# Patient Record
Sex: Female | Born: 1975 | Race: White | Hispanic: No | Marital: Married | Smoking: Never smoker
Health system: Southern US, Community
[De-identification: ages and names within clinical notes are randomized; demographics above are authoritative.]

## PROBLEM LIST (undated history)

## (undated) ENCOUNTER — Inpatient Hospital Stay (HOSPITAL_COMMUNITY): Payer: Self-pay

## (undated) DIAGNOSIS — IMO0002 Reserved for concepts with insufficient information to code with codable children: Secondary | ICD-10-CM

## (undated) DIAGNOSIS — B009 Herpesviral infection, unspecified: Secondary | ICD-10-CM

## (undated) DIAGNOSIS — R87619 Unspecified abnormal cytological findings in specimens from cervix uteri: Secondary | ICD-10-CM

## (undated) HISTORY — PX: LEEP: SHX91

## (undated) HISTORY — PX: NO PAST SURGERIES: SHX2092

---

## 2010-06-01 NOTE — L&D Delivery Note (Signed)
Pt was admitted in early labor.  She had an amniotomy at 7cm because she was making no progress.  Pt completed the first stage without difficulty.  Pt pushed for 2 hours and .  The VE was placed at +2 station secondary to exhaustion.  She delivered one live viable white female over 3 degree midline tear in ROA position on second contraction.  Private cord blood was obtained.  Placenta S/I.  Pt also had a left vaginal wall tear.  All tears were closed with 2-0 Vicryl and 3-0 chromic.  EBL-500cc Baby to NBN.

## 2010-07-15 LAB — RUBELLA ANTIBODY, IGM: Rubella: IMMUNE

## 2010-07-15 LAB — STREP B DNA PROBE: GBS: NEGATIVE

## 2010-07-15 LAB — HIV ANTIBODY (ROUTINE TESTING W REFLEX): HIV: NONREACTIVE

## 2010-07-15 LAB — TYPE AND SCREEN: Antibody Screen: NEGATIVE

## 2010-07-15 LAB — ABO/RH: "RH Type ": POSITIVE

## 2010-07-15 LAB — HEPATITIS B SURFACE ANTIGEN: Hepatitis B Surface Ag: NEGATIVE

## 2010-07-15 LAB — RPR: RPR: NONREACTIVE

## 2011-01-03 ENCOUNTER — Encounter (HOSPITAL_COMMUNITY): Payer: Self-pay

## 2011-01-03 ENCOUNTER — Inpatient Hospital Stay (HOSPITAL_COMMUNITY)
Admission: AD | Admit: 2011-01-03 | Discharge: 2011-01-06 | DRG: 775 | Disposition: A | Discharge status: Home / Self Care | Payer: Managed Care, Other (non HMO) | Source: Ambulatory Visit | Attending: Obstetrics and Gynecology | Admitting: Obstetrics and Gynecology

## 2011-01-03 ENCOUNTER — Encounter (HOSPITAL_COMMUNITY): Payer: Self-pay | Admitting: Anesthesiology

## 2011-01-03 ENCOUNTER — Inpatient Hospital Stay (HOSPITAL_COMMUNITY): Payer: Managed Care, Other (non HMO) | Admitting: Anesthesiology

## 2011-01-03 DIAGNOSIS — Z3480 Encounter for supervision of other normal pregnancy, unspecified trimester: Secondary | ICD-10-CM

## 2011-01-03 DIAGNOSIS — D649 Anemia, unspecified: Secondary | ICD-10-CM | POA: Diagnosis not present

## 2011-01-03 DIAGNOSIS — O9903 Anemia complicating the puerperium: Secondary | ICD-10-CM | POA: Diagnosis not present

## 2011-01-03 HISTORY — DX: Herpesviral infection, unspecified: B00.9

## 2011-01-03 HISTORY — DX: Unspecified abnormal cytological findings in specimens from cervix uteri: R87.619

## 2011-01-03 HISTORY — DX: Reserved for concepts with insufficient information to code with codable children: IMO0002

## 2011-01-03 LAB — ABO/RH: ABO/RH(D): A POS

## 2011-01-03 MED ORDER — OXYTOCIN 20 UNITS IN LACTATED RINGERS INFUSION - SIMPLE
125.0000 mL/h | Freq: Once | INTRAVENOUS | Status: DC
  Filled 2011-01-03: qty 1000

## 2011-01-03 MED ORDER — NALBUPHINE SYRINGE 5 MG/0.5 ML
5.0000 mg | INJECTION | INTRAMUSCULAR | Status: DC | PRN
  Filled 2011-01-03: qty 0.5

## 2011-01-03 MED ORDER — IBUPROFEN 600 MG PO TABS
600.0000 mg | ORAL_TABLET | Freq: Four times a day (QID) | ORAL | Status: DC | PRN
  Administered 2011-01-04: 600 mg via ORAL
  Filled 2011-01-03: qty 1

## 2011-01-03 MED ORDER — LACTATED RINGERS IV SOLN
500.0000 mL | INTRAVENOUS | Status: DC | PRN
  Administered 2011-01-04: 500 mL via INTRAVENOUS

## 2011-01-03 MED ORDER — IBUPROFEN 600 MG PO TABS: 600.0000 mg | ORAL_TABLET | Freq: Four times a day (QID) | ORAL | Status: DC | PRN

## 2011-01-03 MED ORDER — OXYCODONE-ACETAMINOPHEN 5-325 MG PO TABS: 2.0000 | ORAL_TABLET | ORAL | Status: DC | PRN

## 2011-01-03 MED ORDER — ONDANSETRON HCL 4 MG/2ML IJ SOLN: 4.0000 mg | Freq: Four times a day (QID) | INTRAMUSCULAR | Status: DC | PRN

## 2011-01-03 MED ORDER — OXYTOCIN 20 UNITS IN LACTATED RINGERS INFUSION - SIMPLE: 125.0000 mL/h | Freq: Once | INTRAVENOUS | Status: DC

## 2011-01-03 MED ORDER — LACTATED RINGERS IV SOLN: INTRAVENOUS | Status: DC

## 2011-01-03 MED ORDER — DIPHENHYDRAMINE HCL 50 MG/ML IJ SOLN: 12.5000 mg | INTRAMUSCULAR | Status: DC | PRN

## 2011-01-03 MED ORDER — ACETAMINOPHEN 325 MG PO TABS: 650.0000 mg | ORAL_TABLET | ORAL | Status: DC | PRN

## 2011-01-03 MED ORDER — SODIUM CHLORIDE 0.9 % IJ SOLN: 3.0000 mL | Freq: Two times a day (BID) | INTRAMUSCULAR | Status: DC

## 2011-01-03 MED ORDER — PENICILLIN G POTASSIUM 5000000 UNITS IJ SOLR: 5.0000 10*6.[IU] | Freq: Once | INTRAVENOUS | Status: DC

## 2011-01-03 MED ORDER — SODIUM CHLORIDE 0.9 % IV SOLN: 250.0000 mL | INTRAVENOUS | Status: DC

## 2011-01-03 MED ORDER — FENTANYL 2.5 MCG/ML BUPIVACAINE 1/10 % EPIDURAL INFUSION (WH - ANES)
14.0000 mL/h | INTRAMUSCULAR | Status: DC
  Administered 2011-01-03 – 2011-01-04 (×5): 14 mL/h via EPIDURAL
  Filled 2011-01-03 (×5): qty 60

## 2011-01-03 MED ORDER — EPHEDRINE 5 MG/ML INJ
10.0000 mg | INTRAVENOUS | Status: DC | PRN
  Filled 2011-01-03: qty 4

## 2011-01-03 MED ORDER — LACTATED RINGERS IV SOLN: 500.0000 mL | INTRAVENOUS | Status: DC | PRN

## 2011-01-03 MED ORDER — PHENYLEPHRINE 40 MCG/ML (10ML) SYRINGE FOR IV PUSH (FOR BLOOD PRESSURE SUPPORT)
80.0000 ug | PREFILLED_SYRINGE | INTRAVENOUS | Status: DC | PRN
  Filled 2011-01-03: qty 5

## 2011-01-03 MED ORDER — SODIUM CHLORIDE 0.9 % IJ SOLN
3.0000 mL | INTRAMUSCULAR | Status: DC | PRN
  Administered 2011-01-03 (×2): 3 mL via INTRAVENOUS

## 2011-01-03 MED ORDER — LIDOCAINE HCL 1.5 % IJ SOLN
INTRAMUSCULAR | Status: DC | PRN
  Administered 2011-01-03 (×2): 4 mL

## 2011-01-03 MED ORDER — LACTATED RINGERS IV SOLN: 500.0000 mL | Freq: Once | INTRAVENOUS | Status: DC

## 2011-01-03 MED ORDER — PENICILLIN G POTASSIUM 5000000 UNITS IJ SOLR: 2.5000 10*6.[IU] | INTRAVENOUS | Status: DC

## 2011-01-03 MED ORDER — NALBUPHINE SYRINGE 5 MG/0.5 ML: 5.0000 mg | INJECTION | INTRAMUSCULAR | Status: DC | PRN

## 2011-01-03 MED ORDER — OXYTOCIN 20 UNITS IN LACTATED RINGERS INFUSION - SIMPLE
125.0000 mL/h | Freq: Once | INTRAVENOUS | Status: DC
  Administered 2011-01-04: 125 mL/h via INTRAVENOUS

## 2011-01-03 MED ORDER — LACTATED RINGERS IV SOLN
INTRAVENOUS | Status: DC
  Administered 2011-01-03 – 2011-01-04 (×2): via INTRAVENOUS

## 2011-01-03 MED ORDER — EPHEDRINE 5 MG/ML INJ
10.0000 mg | INTRAVENOUS | Status: DC | PRN
  Filled 2011-01-03 (×2): qty 4

## 2011-01-03 MED ORDER — PHENYLEPHRINE 40 MCG/ML (10ML) SYRINGE FOR IV PUSH (FOR BLOOD PRESSURE SUPPORT)
80.0000 ug | PREFILLED_SYRINGE | INTRAVENOUS | Status: DC | PRN
  Filled 2011-01-03 (×2): qty 5

## 2011-01-03 NOTE — Progress Notes (Cosign Needed)
Contractions since 0600 stronger and closer every 6 to 7 minutes G1 41 weeks

## 2011-01-03 NOTE — Progress Notes (Signed)
Dr Dareen Piano called for update on pt status, informed of sve, fhr tracing, uc pattern, has epidural, no new orders received.

## 2011-01-03 NOTE — Anesthesia Preprocedure Evaluation (Signed)
Anesthesia Evaluation  Name, MR# and DOB Patient awake  General Assessment Comment  Reviewed: Allergy & Precautions, H&P  and Patient's Chart, lab work & pertinent test results  Airway Mallampati: II TM Distance: >3 FB Neck ROM: full    Dental No notable dental hx    Pulmonaryneg pulmonary ROS    clear to auscultation  pulmonary exam normal   Cardiovascular    Neuro/PsychNegative Neurological ROS Negative Psych ROS  GI/Hepatic/Renal negative GI ROS, negative Liver ROS, and negative Renal ROS (+)  Controlled   Since 3rd trimester   Endo/Other  Negative Endocrine ROS (+)   Abdominal   Musculoskeletal  Hematology negative hematology ROS (+)   Peds  Reproductive/Obstetrics (+) Pregnancy   Anesthesia Other Findings             Anesthesia Physical Anesthesia Plan  ASA: II  Anesthesia Plan: Epidural   Post-op Pain Management:    Induction:   Airway Management Planned:   Additional Equipment:   Intra-op Plan:   Post-operative Plan:   Informed Consent: I have reviewed the patients History and Physical, chart, labs and discussed the procedure including the risks, benefits and alternatives for the proposed anesthesia with the patient or authorized representative who has indicated his/her understanding and acceptance.     Plan Discussed with: Anesthesiologist  Anesthesia Plan Comments:         Anesthesia Quick Evaluation

## 2011-01-03 NOTE — Progress Notes (Signed)
Pt states, " I've been contracting since 6 am and they are 6-7 min apart with some spotting."

## 2011-01-03 NOTE — ED Notes (Signed)
Pt transferred to room 162 ambulatory per pts request

## 2011-01-03 NOTE — Anesthesia Procedure Notes (Signed)
Epidural Patient location during procedure: OB Start time: 01/03/2011 8:36 PM  Staffing Anesthesiologist: Dewane Timson A. Performed by: anesthesiologist   Preanesthetic Checklist Completed: patient identified, site marked, surgical consent, pre-op evaluation, timeout performed, IV checked, risks and benefits discussed and monitors and equipment checked  Epidural Patient position: sitting Prep: site prepped and draped and DuraPrep Patient monitoring: continuous pulse ox and blood pressure Approach: midline Injection technique: LOR air  Needle:  Needle type: Tuohy  Needle gauge: 17 G Needle length: 9 cm Needle insertion depth: 5 cm cm Catheter type: closed end flexible Catheter size: 19 Gauge Catheter at skin depth: 10 and 9 cm Test dose: negative and 1.5% lidocaine  Assessment Events: blood not aspirated, injection not painful, no injection resistance, negative IV test and no paresthesia  Additional Notes  Patient is more comfortable after epidural dosed. Please see RN's note for documentation of vital signs and FHR which are stable.

## 2011-01-03 NOTE — H&P (Signed)
Pt is a 35 year old white female, G1P0 at term who presents to ER c/o contractions. On admission pt was 4cm and contracting.  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  She has a h.o. Abnormal pap.  PMHx: see [facility]. PE: VSSAF HEENT- wnl Abd-gravid, palp. Contractions FHTs- reactive. IMP/ IUP at term in labor. Plan/ Admit

## 2011-01-04 ENCOUNTER — Encounter (HOSPITAL_COMMUNITY): Payer: Self-pay | Admitting: *Deleted

## 2011-01-04 MED ORDER — DIBUCAINE 1 % RE OINT: 1.0000 "application " | TOPICAL_OINTMENT | RECTAL | Status: DC | PRN

## 2011-01-04 MED ORDER — CEFOTETAN DISODIUM 1 G IJ SOLR
1.0000 g | Freq: Two times a day (BID) | INTRAMUSCULAR | Status: DC
  Filled 2011-01-04 (×2): qty 1

## 2011-01-04 MED ORDER — OXYTOCIN 20 UNITS IN LACTATED RINGERS INFUSION - SIMPLE
2.0000 m[IU]/min | INTRAVENOUS | Status: DC
  Administered 2011-01-04: 2 m[IU]/min via INTRAVENOUS

## 2011-01-04 MED ORDER — DEXTROSE 5 % IV SOLN
1.0000 g | Freq: Once | INTRAVENOUS | Status: AC
  Administered 2011-01-04: 1 g via INTRAVENOUS

## 2011-01-04 MED ORDER — BENZOCAINE-MENTHOL 20-0.5 % EX AERO
1.0000 "application " | INHALATION_SPRAY | CUTANEOUS | Status: DC | PRN
  Administered 2011-01-05: 1 via TOPICAL

## 2011-01-04 MED ORDER — LIDOCAINE HCL (PF) 1 % IJ SOLN
30.0000 mL | INTRAMUSCULAR | Status: DC | PRN
  Filled 2011-01-04 (×2): qty 30

## 2011-01-04 MED ORDER — TERBUTALINE SULFATE 1 MG/ML IJ SOLN: 0.2500 mg | Freq: Once | INTRAMUSCULAR | Status: DC | PRN

## 2011-01-04 MED ORDER — WITCH HAZEL-GLYCERIN EX PADS: 1.0000 "application " | MEDICATED_PAD | CUTANEOUS | Status: DC | PRN

## 2011-01-04 MED ORDER — OXYCODONE-ACETAMINOPHEN 5-325 MG PO TABS: 1.0000 | ORAL_TABLET | ORAL | Status: DC | PRN

## 2011-01-04 MED ORDER — MEASLES, MUMPS & RUBELLA VAC ~~LOC~~ INJ: 0.5000 mL | INJECTION | Freq: Once | SUBCUTANEOUS | Status: DC

## 2011-01-04 MED ORDER — IBUPROFEN 600 MG PO TABS
600.0000 mg | ORAL_TABLET | Freq: Four times a day (QID) | ORAL | Status: DC
  Administered 2011-01-04 – 2011-01-06 (×6): 600 mg via ORAL
  Filled 2011-01-04 (×6): qty 1

## 2011-01-04 MED ORDER — ONDANSETRON HCL 4 MG/2ML IJ SOLN: 4.0000 mg | INTRAMUSCULAR | Status: DC | PRN

## 2011-01-04 MED ORDER — SIMETHICONE 80 MG PO CHEW: 80.0000 mg | CHEWABLE_TABLET | ORAL | Status: DC | PRN

## 2011-01-04 MED ORDER — ONDANSETRON HCL 4 MG PO TABS: 4.0000 mg | ORAL_TABLET | ORAL | Status: DC | PRN

## 2011-01-04 MED ORDER — ZOLPIDEM TARTRATE 5 MG PO TABS: 5.0000 mg | ORAL_TABLET | Freq: Every evening | ORAL | Status: DC | PRN

## 2011-01-04 NOTE — Progress Notes (Signed)
Pt had AROM. Light meconium noted. Cx- 90/6-7/0 FHTs - reactive.

## 2011-01-05 ENCOUNTER — Encounter (HOSPITAL_COMMUNITY): Payer: Self-pay

## 2011-01-05 LAB — CBC
HCT: 17 % — ABNORMAL LOW (ref 36.0–46.0)
Hemoglobin: 5.8 g/dL — CL (ref 12.0–15.0)
MCH: 29.5 pg (ref 26.0–34.0)
MCHC: 33.5 g/dL (ref 30.0–36.0)
MCV: 88.1 fL (ref 78.0–100.0)
RBC: 1.93 MIL/uL — ABNORMAL LOW (ref 3.87–5.11)
RDW: 13.9 % (ref 11.5–15.5)
WBC: 13.3 10*3/uL — ABNORMAL HIGH (ref 4.0–10.5)

## 2011-01-05 MED ORDER — BENZOCAINE-MENTHOL 20-0.5 % EX AERO
INHALATION_SPRAY | CUTANEOUS | Status: AC
  Administered 2011-01-05: 1 via TOPICAL
  Filled 2011-01-05: qty 56

## 2011-01-05 MED ORDER — EPINEPHRINE TOPICAL FOR CIRCUMCISION 0.1 MG/ML: 1.0000 [drp] | TOPICAL | Status: DC | PRN

## 2011-01-05 MED ORDER — ACETAMINOPHEN FOR CIRCUMCISION 160 MG/5 ML: 40.0000 mg | Freq: Once | ORAL | Status: DC

## 2011-01-05 MED ORDER — SUCROSE 24 % ORAL SOLUTION: 1.0000 mL | OROMUCOSAL | Status: DC

## 2011-01-05 MED ORDER — FERROUS SULFATE 325 (65 FE) MG PO TABS
325.0000 mg | ORAL_TABLET | Freq: Two times a day (BID) | ORAL | Status: DC
  Administered 2011-01-05 – 2011-01-06 (×2): 325 mg via ORAL
  Filled 2011-01-05 (×2): qty 1

## 2011-01-05 MED ORDER — LIDOCAINE 1%/NA BICARB 0.1 MEQ INJECTION: 0.8000 mL | INJECTION | Freq: Once | INTRAVENOUS | Status: DC

## 2011-01-05 MED ORDER — ACETAMINOPHEN FOR CIRCUMCISION 160 MG/5 ML: 40.0000 mg | Freq: Once | ORAL | Status: DC | PRN

## 2011-01-05 MED ORDER — DOCUSATE SODIUM 100 MG PO CAPS
100.0000 mg | ORAL_CAPSULE | Freq: Two times a day (BID) | ORAL | Status: DC | PRN
  Administered 2011-01-05: 100 mg via ORAL
  Filled 2011-01-05: qty 1

## 2011-01-05 MED ORDER — HYDROCODONE-ACETAMINOPHEN 5-325 MG PO TABS
1.0000 | ORAL_TABLET | ORAL | Status: DC | PRN
  Administered 2011-01-05: 1 via ORAL
  Filled 2011-01-05: qty 1

## 2011-01-05 NOTE — Anesthesia Postprocedure Evaluation (Signed)
  Anesthesia Post-op Note  Patient: Jo Campbell  Procedure(s) Performed: * Lumbar Epidural for L & D *  Patient Location: Labor and Delivery  Anesthesia Type: Epidural  Level of Consciousness: awake, alert  and oriented  Airway and Oxygen Therapy: Patient Spontanous Breathing  Post-op Pain: none  Post-op Assessment: Post-op Vital signs reviewed, Patient's Cardiovascular Status Stable, Respiratory Function Stable, Patent Airway, No signs of Nausea or vomiting, Pain level controlled, No headache, No backache, No residual numbness and No residual motor weakness  Post-op Vital Signs: Reviewed and stable  Complications: No apparent anesthesia complications

## 2011-01-05 NOTE — Progress Notes (Signed)
Called by nursing service for low HGB.  Pt had 3rd degree tear and left vaginal wall tear.  Pt has no symptoms.  Will repeat CBC at [facility] today.

## 2011-01-05 NOTE — Anesthesia Postprocedure Evaluation (Signed)
  Anesthesia Post-op Note  Patient: Jo Campbell  Procedure(s) Performed: * No surgery found *  Patient Location: PACU  Anesthesia Type: Epidural  Level of Consciousness: awake  Airway and Oxygen Therapy: Patient Spontanous Breathing  Post-op Pain: none  Post-op Assessment: Post-op Vital signs reviewed  Post-op Vital Signs: Reviewed and stable  Complications: No apparent anesthesia complications Anesthesia Post Note  Patient: Jo Campbell  Procedure(s) Performed: * No surgery found *  Anesthesia type: Epidural  Patient location: Mother/Baby  Post pain: Pain level controlled  Post assessment: Post-op Vital signs reviewed  Last Vitals:  Filed Vitals:   01/05/11 0613  BP: 100/55  Pulse: 90  Temp: 98 F (36.7 C)  Resp: 18    Post vital signs: Reviewed  Level of consciousness: awake  Complications: No apparent anesthesia complications

## 2011-01-05 NOTE — Anesthesia Postprocedure Evaluation (Signed)
  Anesthesia Post-op Note  Anesthesia Post Note  Patient: Jo Campbell  Procedure(s) Performed: * No procedures listed *  Anesthesia type: Epidural  Patient location: Mother/Baby  Post pain: Pain level controlled  Post assessment: Post-op Vital signs reviewed  Last Vitals:  Filed Vitals:   01/05/11 0613  BP: 100/55  Pulse: 90  Temp: 98 F (36.7 C)  Resp: 18    Post vital signs: Reviewed  Level of consciousness: awake  Complications: No apparent anesthesia complications

## 2011-01-05 NOTE — Progress Notes (Signed)
Encounter addended by: Rosalia Hammers on: 01/05/2011 12:25 PM<BR>     Documentation filed: Notes Section, Charges VN

## 2011-01-05 NOTE — Progress Notes (Signed)
  Postpartum day #1 Subjective: Patient is feeling better than yesterday. Yesterday she was having problems with extreme fatigue, dizziness, lightheadedness, and presyncopal episodes. Today she is able to ambulate without feeling like she's going to pass out but she does feel weak. Postpartum bleeding is appropriate. She denies nausea or vomiting. The baby is doing well and breast-feeding without difficulty Objective: Filed Vitals:   01/05/11 0613  BP: 100/55  Pulse: 90  Temp: 98 F (36.7 C)  Resp: 18   General: Alert and oriented x3, pale appearing. Abdomen: soft nontender nondistended, fundus is firm and below umbilicus Extremities: No clubbing cyanosis or edema WBC  Date Value Range Status  01/05/2011 13.3* 4.0-10.5 (K/uL) Final     Hemoglobin  Date Value Range Status  01/05/2011 5.8* 12.0-15.0 (g/dL) Final     DELTA CHECK NOTED     REPEATED TO VERIFY     CRITICAL RESULT CALLED TO, READ BACK BY AND VERIFIED WITH:     GOZA, J @ 0553 ON 01/05/11 BY BOVELL. A     Platelets  Date Value Range Status  01/05/2011 146* 150-400 (K/uL) Final     DELTA CHECK NOTED     REPEATED TO VERIFY     SPECIMEN CHECKED FOR CLOTS    Assessment and plan: Post partum day #1 status post vaginal delivery 1) routine postpartum care 2) postpartum anemia, hemoglobin today is 5.8. The patient's predelivery hemoglobin was 12. The patient does feel fatigued however she's no longer having any lightheadedness or presyncopal episodes. Discussed options for blood transfusion versus replacement of iron with oral iron. At this time the patient wishes to continue with oral iron supplementation and daily stool softener. Repeat hemoglobin is ordered for the morning 3) neonatal circumcision is desired. Risks benefits and alternatives are discussed with the patient and her husband. Will proceed

## 2011-01-05 NOTE — Anesthesia Postprocedure Evaluation (Signed)
  Anesthesia Post-op Note  Anesthesia Post Note  Patient: Jo Campbell  Procedure(s) Performed: * No procedures listed *  Anesthesia type: Epidural  Patient location: Mother/Baby  Post pain: Pain level controlled  Post assessment: Post-op Vital signs reviewed  Last Vitals:  Filed Vitals:   01/05/11 0613  BP: 100/55  Pulse: 90  Temp: 98 F (36.7 C)  Resp: 18    Post vital signs: Reviewed  Level of consciousness: awake  Complications: No apparent anesthesia complications  

## 2011-01-05 NOTE — Progress Notes (Signed)
BABY HAS NOT BEEN LATCHING WELL. MOM JUST PUMPED 5 CC'S FROMM EACH BREAST.  INSTRUCTED ON DROPPER FEEDING BABY.  LC TO ASSIST WITH NEXT FEEDING ATTEMPT.

## 2011-01-06 LAB — CBC
MCH: 30.6 pg (ref 26.0–34.0)
MCHC: 34.1 g/dL (ref 30.0–36.0)
MCV: 89.8 fL (ref 78.0–100.0)
Platelets: 183 10*3/uL (ref 150–400)

## 2011-01-06 MED ORDER — IBUPROFEN 600 MG PO TABS: 600.0000 mg | ORAL_TABLET | Freq: Four times a day (QID) | ORAL | Status: AC

## 2011-01-06 MED ORDER — FERROUS SULFATE 325 (65 FE) MG PO TABS: 325.0000 mg | ORAL_TABLET | Freq: Two times a day (BID) | ORAL | Status: DC

## 2011-01-06 MED ORDER — DSS 100 MG PO CAPS: 100.0000 mg | ORAL_CAPSULE | Freq: Two times a day (BID) | ORAL | Status: AC | PRN

## 2011-01-06 MED ORDER — HYDROCODONE-ACETAMINOPHEN 5-325 MG PO TABS: 1.0000 | ORAL_TABLET | ORAL | Status: AC | PRN

## 2011-01-06 NOTE — Progress Notes (Signed)
PPD#2 Pt still c/o some discomfort in perineum.  Improved from yesterday.  Lochia - light. VSSAF IMP/ Stable, anemic PLAN/ D/C to home

## 2011-01-06 NOTE — Discharge Summary (Signed)
  Pt presented in labor.  She had a VE delivery with a 3rd degree tear.  See delivery note for complete description.  Postpartum she had anemia, but was asymptomatic.  She was ambulating without difficulty and was AF.  She was discharged to home on day #2.  She will follow up in office in 4 weeks.  Baby discharged to home with mother.

## 2012-06-01 NOTE — L&D Delivery Note (Signed)
Delivery Note At 4:24 AM a viable and healthy female was delivered via Vaginal, Spontaneous Delivery (Presentation: Left Occiput Posterior).  APGAR: 5, 9; weight pending.   Placenta status: Intact, Spontaneous.  Cord: 3 vessels with the following complications: None.  Cord pH: 7.221  Meconium stained fluid was noted with crowning. As the head was crowning it was noted to rotate from OP to OT/OA. The body then delivered and the infant initially cried vigorously.  After being placed on the mothers abdomen the infant stopped crying and appeared depressed.  The cord was then clamped and cut and the infant was passed to the isolette for resuscitation. The infant responded well and by 5 minutes she had an apgar of 9.  Cord gas was collected and sent and then cord blood was collected for private banking. With delivery of the placenta a large gush of blood was noted. Placental inspection after the perineal repair showed adherent blood vs partial abruption. A second degree perineal repair was performed with 3-0 vicryl and 4-0 vicryl.  After delivery mothe and baby are doing well  Anesthesia: Epidural  Episiotomy: None Lacerations: 2nd degree Suture Repair: 3.0 vicryl 4-0 vicryl Est. Blood Loss (mL): 350 cc  Mom to postpartum.  Baby to nursery-stable.  Reet Scharrer H. 12/20/2012, 5:08 AM

## 2012-06-12 ENCOUNTER — Inpatient Hospital Stay (HOSPITAL_COMMUNITY)
Admission: AD | Admit: 2012-06-12 | Discharge: 2012-06-12 | Disposition: A | Discharge status: Home / Self Care | Payer: BC Managed Care – PPO | Source: Ambulatory Visit | Attending: Obstetrics and Gynecology | Admitting: Obstetrics and Gynecology

## 2012-06-12 ENCOUNTER — Encounter (HOSPITAL_COMMUNITY): Payer: Self-pay | Admitting: Family

## 2012-06-12 ENCOUNTER — Inpatient Hospital Stay (HOSPITAL_COMMUNITY): Payer: BC Managed Care – PPO

## 2012-06-12 DIAGNOSIS — O418X90 Other specified disorders of amniotic fluid and membranes, unspecified trimester, not applicable or unspecified: Secondary | ICD-10-CM

## 2012-06-12 DIAGNOSIS — O209 Hemorrhage in early pregnancy, unspecified: Secondary | ICD-10-CM | POA: Insufficient documentation

## 2012-06-12 NOTE — MAU Provider Note (Signed)
Chief Complaint  Patient presents with  . Vaginal Bleeding    Subjective Deari Campbell 37 y.o.  G2P1001 @[redacted]w[redacted]d  presents with onset 1-2 hr PTA of first episode bright red vaginal bleeding, about 1 Tbsp at home, then more BRB here after wiping. Has mimimal lower abdominal cramping, not painful.  No antecedent intercourse  Denies abnormal vaginal discharge or irritation. No dysuria or hematuria.  Blood type: A pos Pregnancy course uncomplicated  Past Medical History  Diagnosis Date  . Abnormal Pap smear   . GERD (gastroesophageal reflux disease)   . HSV-1 (herpes simplex virus 1) infection    Ob/Ggn HX: NSVD, ABL anemia; hx LEEP Medications: PNV Objective   Filed Vitals:   06/12/12 1436  BP: 125/77  Pulse: 85  Temp: 97.7 F (36.5 C)  Resp: 18     Physical Exam General: WN/WD in NAD  Abdom: soft, NT, S=D, DT FHR 150's Pelvic:External genitalia: normal; BUS neg             Spec: small amount menstrual-like blood  in vault                      Cx multiparous, no lesions, appears closed, blood from os and no active bleeding after swab           SVE: closed, thick, some bulging of LUS  Lab Results No results found for this or any previous visit (from the past 24 hour(s)).  Ultrasound *RADIOLOGY REPORT*  Clinical Data: Bleeding  OBSTETRIC <14 WK ULTRASOUND,  Technique: Transabdominal ultrasound was performed for evaluation  of the gestation as well as the maternal uterus and adnexal  regions.  Number of gestation: 1  Heart Rate: 161 bpm  CRL: 7.8 13w 6d [place] EDC: 12/16/2012  Maternal uterus/adnexae:  Large subchorionic hemorrhage measuring 6.4 x 3.1 x 5.6 cm.  Normal appearance of the right ovary. The left ovary is not  visualized.  Cervical length equals 3.3 cm.  Posterior placenta.  Trace free fluid within the pelvis.  IMPRESSION:  1. Single living intrauterine gestation with an estimated  gestational age of [redacted] weeks and 6 days.  2. Large subchorionic hemorrhage.    Original Report Authenticated By: Signa Kell, M.D.    Assessment 1. Bleeding in early pregnancy   2. Subchorionic hematoma      Plan    D/W Dr. Tenny Craw Discharge with AVS on EPB F/U in office as scheduled    Jo Campbell 06/12/2012 3:42 PM

## 2012-06-12 NOTE — MAU Note (Signed)
Patient presents to MAU with c/o vaginal bleeding beginning at 1300 today; noticed gush of blood when walking from lobby to triage. Reports abdominal cramping earlier this morning, and then slight cramping again at 1300.  Denies antecedent trauma or intercourse.

## 2012-11-03 ENCOUNTER — Encounter (HOSPITAL_COMMUNITY): Payer: Self-pay | Admitting: *Deleted

## 2012-11-03 ENCOUNTER — Inpatient Hospital Stay (HOSPITAL_COMMUNITY)
Admission: AD | Admit: 2012-11-03 | Discharge: 2012-11-04 | Disposition: A | Discharge status: Home / Self Care | Payer: BC Managed Care – PPO | Source: Ambulatory Visit | Attending: Obstetrics and Gynecology | Admitting: Obstetrics and Gynecology

## 2012-11-03 DIAGNOSIS — O4700 False labor before 37 completed weeks of gestation, unspecified trimester: Secondary | ICD-10-CM | POA: Insufficient documentation

## 2012-11-03 DIAGNOSIS — O479 False labor, unspecified: Secondary | ICD-10-CM

## 2012-11-03 LAB — URINALYSIS, ROUTINE W REFLEX MICROSCOPIC
Bilirubin Urine: NEGATIVE
Ketones, ur: NEGATIVE mg/dL
Nitrite: NEGATIVE
Protein, ur: NEGATIVE mg/dL

## 2012-11-03 LAB — URINE MICROSCOPIC-ADD ON

## 2012-11-03 MED ORDER — NIFEDIPINE 10 MG PO CAPS
10.0000 mg | ORAL_CAPSULE | ORAL | Status: AC
  Administered 2012-11-03: 10 mg via ORAL
  Filled 2012-11-03: qty 3

## 2012-11-03 NOTE — MAU Note (Signed)
Pt states she thinks she has been having u/c's since 1800 but unsure if that is what she is feeling.  No vaginal bleeding or ROM.  Reports good fetal movement.

## 2012-11-03 NOTE — MAU Provider Note (Signed)
  History     CSN: [id number]  Arrival date and time: 11/03/12 2230   None     Chief Complaint  Patient presents with  . Labor Eval   HPI Jo Campbell is a 37 y.o. G2P1001 at [redacted]w[redacted]d who presents tonight with uterine contractions. She states that they began around 1800 today and were initially about q 10-15 min then they stopped for a while, but returned. She states that the baby has been moving normally. She denies any VB or LOF or vaginal discharge. She was last seen in the office about a week ago. She has not had a cervical exam. She has not had any complications. She did not have any complications with her first pregnancy, and had a full term vaginal delivery.  Past Medical History  Diagnosis Date  . Abnormal Pap smear   . HSV-1 (herpes simplex virus 1) infection     Past Surgical History  Procedure Laterality Date  . No past surgeries    . Leep      History reviewed. No pertinent family history.  History  Substance Use Topics  . Smoking status: Never Smoker   . Smokeless tobacco: Never Used  . Alcohol Use: No    Allergies: No Known Allergies  Prescriptions prior to admission  Medication Sig Dispense Refill  . Prenatal Vit-Fe Fumarate-FA (PRENATAL MULTIVITAMIN) TABS Take 1 tablet by mouth daily.        Review of Systems  Constitutional: Negative for fever and chills.  Eyes: Negative for blurred vision.  Gastrointestinal: Positive for abdominal pain (contractions) and diarrhea (earlier in the week). Negative for nausea and vomiting.  Genitourinary: Negative for dysuria, urgency and frequency.  Neurological: Negative for dizziness and headaches.   Physical Exam   Blood pressure 116/74, pulse 89, temperature 97.4 F (36.3 C), temperature source Oral, resp. rate 18, height 5' 6.5" (1.689 m), weight 69.128 kg (152 lb 6.4 oz), last menstrual period 03/05/2012, SpO2 100.00%, unknown if currently breastfeeding.  Physical Exam  Nursing note and vitals  reviewed. Constitutional: She is oriented to person, place, and time. She appears well-developed and well-nourished. No distress.  Cardiovascular: Normal rate.   Respiratory: Effort normal.  GI: Soft. She exhibits no distension.  Genitourinary:  Cervix: FT/Thick/-2  Neurological: She is alert and oriented to person, place, and time.  Skin: Skin is warm and dry.  Psychiatric: She has a normal mood and affect.   FHT: 140, moderate with 15x15 accels, no decels Toco: some UI with once UC since being on the monitor.  MAU Course  Procedures  2314: Spoke with Dr. Henderson Cloud. Pt to have procardia, and if symptoms improve she may be sent home.  0031: Rechecked cervix, no change  Assessment and Plan   1. Labor, false (Braxton-Hicks), antepartum    Preterm labor danger signs reviewed FU with [facility] as sheduled   Tawnya Crook 11/03/2012, 10:58 PM

## 2012-11-04 DIAGNOSIS — O4700 False labor before 37 completed weeks of gestation, unspecified trimester: Secondary | ICD-10-CM

## 2012-11-10 LAB — OB RESULTS CONSOLE GBS: GBS: NEGATIVE

## 2012-11-19 LAB — OB RESULTS CONSOLE HIV ANTIBODY (ROUTINE TESTING): HIV: NONREACTIVE

## 2012-12-19 ENCOUNTER — Encounter (HOSPITAL_COMMUNITY): Payer: Self-pay | Admitting: Anesthesiology

## 2012-12-19 ENCOUNTER — Inpatient Hospital Stay (HOSPITAL_COMMUNITY)
Admission: AD | Admit: 2012-12-19 | Discharge: 2012-12-21 | DRG: 372 | Disposition: A | Discharge status: Home / Self Care | Payer: BC Managed Care – PPO | Source: Ambulatory Visit | Attending: Obstetrics and Gynecology | Admitting: Obstetrics and Gynecology

## 2012-12-19 ENCOUNTER — Encounter (HOSPITAL_COMMUNITY): Payer: Self-pay | Admitting: *Deleted

## 2012-12-19 ENCOUNTER — Inpatient Hospital Stay (HOSPITAL_COMMUNITY): Payer: BC Managed Care – PPO | Admitting: Anesthesiology

## 2012-12-19 DIAGNOSIS — O09529 Supervision of elderly multigravida, unspecified trimester: Secondary | ICD-10-CM | POA: Diagnosis present

## 2012-12-19 DIAGNOSIS — O480 Post-term pregnancy: Principal | ICD-10-CM | POA: Diagnosis present

## 2012-12-19 DIAGNOSIS — O4590 Premature separation of placenta, unspecified, unspecified trimester: Secondary | ICD-10-CM | POA: Diagnosis present

## 2012-12-19 LAB — TYPE AND SCREEN
ABO/RH(D): A POS
Antibody Screen: NEGATIVE

## 2012-12-19 LAB — CBC
Hemoglobin: 12.3 g/dL (ref 12.0–15.0)
MCH: 30 pg (ref 26.0–34.0)
MCHC: 35.3 g/dL (ref 30.0–36.0)
Platelets: 213 10*3/uL (ref 150–400)

## 2012-12-19 MED ORDER — PHENYLEPHRINE 40 MCG/ML (10ML) SYRINGE FOR IV PUSH (FOR BLOOD PRESSURE SUPPORT): 80.0000 ug | PREFILLED_SYRINGE | INTRAVENOUS | Status: DC | PRN

## 2012-12-19 MED ORDER — BUTORPHANOL TARTRATE 1 MG/ML IJ SOLN: 1.0000 mg | INTRAMUSCULAR | Status: DC | PRN

## 2012-12-19 MED ORDER — OXYTOCIN 40 UNITS IN LACTATED RINGERS INFUSION - SIMPLE MED: 62.5000 mL/h | INTRAVENOUS | Status: DC

## 2012-12-19 MED ORDER — LIDOCAINE HCL (PF) 1 % IJ SOLN
30.0000 mL | INTRAMUSCULAR | Status: DC | PRN
  Filled 2012-12-19: qty 30

## 2012-12-19 MED ORDER — OXYTOCIN BOLUS FROM INFUSION
500.0000 mL | INTRAVENOUS | Status: DC
  Administered 2012-12-20: 500 mL via INTRAVENOUS

## 2012-12-19 MED ORDER — CITRIC ACID-SODIUM CITRATE 334-500 MG/5ML PO SOLN: 30.0000 mL | ORAL | Status: DC | PRN

## 2012-12-19 MED ORDER — LACTATED RINGERS IV SOLN
INTRAVENOUS | Status: DC
  Administered 2012-12-19: 22:00:00 via INTRAVENOUS

## 2012-12-19 MED ORDER — OXYCODONE-ACETAMINOPHEN 5-325 MG PO TABS: 1.0000 | ORAL_TABLET | ORAL | Status: DC | PRN

## 2012-12-19 MED ORDER — EPHEDRINE 5 MG/ML INJ: 10.0000 mg | INTRAVENOUS | Status: DC | PRN

## 2012-12-19 MED ORDER — ONDANSETRON HCL 4 MG/2ML IJ SOLN: 4.0000 mg | Freq: Four times a day (QID) | INTRAMUSCULAR | Status: DC | PRN

## 2012-12-19 MED ORDER — TERBUTALINE SULFATE 1 MG/ML IJ SOLN: 0.2500 mg | Freq: Once | INTRAMUSCULAR | Status: AC | PRN

## 2012-12-19 MED ORDER — OXYTOCIN 40 UNITS IN LACTATED RINGERS INFUSION - SIMPLE MED
1.0000 m[IU]/min | INTRAVENOUS | Status: DC
  Administered 2012-12-19: 4 m[IU]/min via INTRAVENOUS
  Administered 2012-12-19: 2 m[IU]/min via INTRAVENOUS

## 2012-12-19 MED ORDER — ACETAMINOPHEN 325 MG PO TABS: 650.0000 mg | ORAL_TABLET | ORAL | Status: DC | PRN

## 2012-12-19 MED ORDER — EPHEDRINE 5 MG/ML INJ
10.0000 mg | INTRAVENOUS | Status: DC | PRN
  Filled 2012-12-19: qty 4

## 2012-12-19 MED ORDER — ZOLPIDEM TARTRATE 5 MG PO TABS: 5.0000 mg | ORAL_TABLET | Freq: Every evening | ORAL | Status: DC | PRN

## 2012-12-19 MED ORDER — LACTATED RINGERS IV SOLN
500.0000 mL | INTRAVENOUS | Status: DC | PRN
  Administered 2012-12-20: 500 mL via INTRAVENOUS

## 2012-12-19 MED ORDER — OXYTOCIN BOLUS FROM INFUSION: 500.0000 mL | INTRAVENOUS | Status: DC

## 2012-12-19 MED ORDER — LIDOCAINE HCL (PF) 1 % IJ SOLN
INTRAMUSCULAR | Status: DC | PRN
  Administered 2012-12-19 (×4): 4 mL

## 2012-12-19 MED ORDER — OXYTOCIN 40 UNITS IN LACTATED RINGERS INFUSION - SIMPLE MED: 1.0000 m[IU]/min | INTRAVENOUS | Status: DC

## 2012-12-19 MED ORDER — DIPHENHYDRAMINE HCL 50 MG/ML IJ SOLN: 12.5000 mg | INTRAMUSCULAR | Status: DC | PRN

## 2012-12-19 MED ORDER — IBUPROFEN 600 MG PO TABS: 600.0000 mg | ORAL_TABLET | Freq: Four times a day (QID) | ORAL | Status: DC | PRN

## 2012-12-19 MED ORDER — TERBUTALINE SULFATE 1 MG/ML IJ SOLN: 0.2500 mg | Freq: Once | INTRAMUSCULAR | Status: DC | PRN

## 2012-12-19 MED ORDER — LIDOCAINE HCL (PF) 1 % IJ SOLN: 30.0000 mL | INTRAMUSCULAR | Status: DC | PRN

## 2012-12-19 MED ORDER — OXYTOCIN 40 UNITS IN LACTATED RINGERS INFUSION - SIMPLE MED
INTRAVENOUS | Status: AC
  Filled 2012-12-19: qty 1000

## 2012-12-19 MED ORDER — LACTATED RINGERS IV SOLN
INTRAVENOUS | Status: DC
  Administered 2012-12-19: 900 mL via INTRAVENOUS
  Administered 2012-12-19: 19:00:00 via INTRAVENOUS

## 2012-12-19 MED ORDER — LACTATED RINGERS IV SOLN: 500.0000 mL | INTRAVENOUS | Status: DC | PRN

## 2012-12-19 MED ORDER — LACTATED RINGERS IV SOLN
500.0000 mL | Freq: Once | INTRAVENOUS | Status: AC
  Administered 2012-12-19: 500 mL via INTRAVENOUS

## 2012-12-19 MED ORDER — FENTANYL 2.5 MCG/ML BUPIVACAINE 1/10 % EPIDURAL INFUSION (WH - ANES)
14.0000 mL/h | INTRAMUSCULAR | Status: DC | PRN
  Administered 2012-12-19: 14 mL/h via EPIDURAL
  Filled 2012-12-19: qty 125

## 2012-12-19 MED ORDER — PHENYLEPHRINE 40 MCG/ML (10ML) SYRINGE FOR IV PUSH (FOR BLOOD PRESSURE SUPPORT)
80.0000 ug | PREFILLED_SYRINGE | INTRAVENOUS | Status: DC | PRN
  Filled 2012-12-19: qty 5

## 2012-12-19 NOTE — Anesthesia Procedure Notes (Signed)
Epidural Patient location during procedure: OB Start time: 12/19/2012 8:44 PM  Staffing Performed by: anesthesiologist   Preanesthetic Checklist Completed: patient identified, site marked, surgical consent, pre-op evaluation, timeout performed, IV checked, risks and benefits discussed and monitors and equipment checked  Epidural Patient position: sitting Prep: site prepped and draped and DuraPrep Patient monitoring: continuous pulse ox and blood pressure Approach: midline Injection technique: LOR air  Needle:  Needle type: Tuohy  Needle gauge: 17 G Needle length: 9 cm and 9 Needle insertion depth: 5 cm cm Catheter type: closed end flexible Catheter size: 19 Gauge Catheter at skin depth: 10 cm Test dose: negative  Assessment Events: blood not aspirated, injection not painful, no injection resistance, negative IV test and no paresthesia  Additional Notes Discussed risk of headache, infection, bleeding, nerve injury and failed or incomplete block.  Patient voices understanding and wishes to proceed.  Epidural placed easily on first attempt.  No paresthesia.  Patient tolerated procedure well with no apparent complications.  Jasmine December, MD Reason for block:procedure for pain

## 2012-12-19 NOTE — Progress Notes (Signed)
Patient ID: Jo Campbell, female   DOB: Sep 03, 1975, 37 y.o.   MRN: [id number]  S: Comfortable after epidural O: AFVSS Dilation: 4 Effacement (%): 70 Cervical Position: Middle Station: -2 Presentation: Vertex Exam by:: Dr Tenny Craw FHT 140-150 reactive with accels no decels, catecory 1 tracing  AROM clear fluid  A/P:  1) FWB reassuring 2) Cont pit

## 2012-12-19 NOTE — H&P (Signed)
Jo Campbell is a 37 y.o. female presenting for postdates induction of labor  37 yo G2P1001 @ 40+3 presenting for postdates induction of labor.  Pts pregnancy has been complicated by advanced maternal age and partial placental abruption at [redacted] weeks gestation.  This has subsequently resolved and there has been no adverse effects on fetal growth.  History OB History   Grav Para Term Preterm Abortions TAB SAB Ect Mult Living   2 1 1       1      Past Medical History  Diagnosis Date  . Abnormal Pap smear   . HSV-1 (herpes simplex virus 1) infection    Past Surgical History  Procedure Laterality Date  . No past surgeries    . Leep     Family History: family history is not on file. Social History:  reports that she has never smoked. She has never used smokeless tobacco. She reports that she does not drink alcohol or use illicit drugs.   Prenatal Transfer Tool  Maternal Diabetes: No Genetic Screening: Normal, NIPT less than 1 in 16109 Maternal Ultrasounds/Referrals: Normal Fetal Ultrasounds or other Referrals:  None Maternal Substance Abuse:  No Significant Maternal Medications:  None Significant Maternal Lab Results:  None Other Comments:  None  ROS: as above  Dilation: 3 Effacement (%): 70 Station: -2 Exam by:: Dherr rn Blood pressure 127/89, pulse 84, temperature 97.9 F (36.6 C), temperature source Oral, resp. rate 18, height 5\' 6"  (1.676 m), weight 70.308 kg (155 lb), last menstrual period 03/05/2012, unknown if currently breastfeeding. Exam Physical Exam  Prenatal labs: ABO, Rh:  A positive Antibody:  Negative Rubella:  Immune RPR: Nonreactive (06/21 0000)  HBsAg:   Negative HIV: Non-reactive (06/21 0000)  GBS:   Negative  Assessment/Plan: 1) Admit 2) AROM/Pit 3) Epidural on request   Jo Brennan H. 12/19/2012, 8:01 PM

## 2012-12-19 NOTE — Anesthesia Preprocedure Evaluation (Signed)
Anesthesia Evaluation  Patient identified by MRN, date of birth, ID band Patient awake    Reviewed: Allergy & Precautions, H&P , NPO status , Patient's Chart, lab work & pertinent test results, reviewed documented beta blocker date and time   History of Anesthesia Complications Negative for: history of anesthetic complications  Airway Mallampati: II TM Distance: >3 FB Neck ROM: full    Dental  (+) Teeth Intact   Pulmonary neg pulmonary ROS,  breath sounds clear to auscultation        Cardiovascular negative cardio ROS  Rhythm:regular Rate:Normal     Neuro/Psych negative neurological ROS  negative psych ROS   GI/Hepatic Neg liver ROS, GERD-  Medicated,  Endo/Other  negative endocrine ROS  Renal/GU negative Renal ROS     Musculoskeletal   Abdominal   Peds  Hematology negative hematology ROS (+)   Anesthesia Other Findings   Reproductive/Obstetrics (+) Pregnancy                           Anesthesia Physical Anesthesia Plan  ASA: II  Anesthesia Plan: Epidural   Post-op Pain Management:    Induction:   Airway Management Planned:   Additional Equipment:   Intra-op Plan:   Post-operative Plan:   Informed Consent: I have reviewed the patients History and Physical, chart, labs and discussed the procedure including the risks, benefits and alternatives for the proposed anesthesia with the patient or authorized representative who has indicated his/her understanding and acceptance.     Plan Discussed with:   Anesthesia Plan Comments:         Anesthesia Quick Evaluation

## 2012-12-20 ENCOUNTER — Encounter (HOSPITAL_COMMUNITY): Payer: Self-pay | Admitting: Obstetrics

## 2012-12-20 MED ORDER — WITCH HAZEL-GLYCERIN EX PADS: 1.0000 "application " | MEDICATED_PAD | CUTANEOUS | Status: DC | PRN

## 2012-12-20 MED ORDER — SENNOSIDES-DOCUSATE SODIUM 8.6-50 MG PO TABS
2.0000 | ORAL_TABLET | Freq: Every day | ORAL | Status: DC
  Administered 2012-12-20: 2 via ORAL

## 2012-12-20 MED ORDER — PRENATAL MULTIVITAMIN CH: 1.0000 | ORAL_TABLET | Freq: Every day | ORAL | Status: DC

## 2012-12-20 MED ORDER — DIBUCAINE 1 % RE OINT: 1.0000 "application " | TOPICAL_OINTMENT | RECTAL | Status: DC | PRN

## 2012-12-20 MED ORDER — ONDANSETRON HCL 4 MG PO TABS: 4.0000 mg | ORAL_TABLET | ORAL | Status: DC | PRN

## 2012-12-20 MED ORDER — ZOLPIDEM TARTRATE 5 MG PO TABS: 5.0000 mg | ORAL_TABLET | Freq: Every evening | ORAL | Status: DC | PRN

## 2012-12-20 MED ORDER — SIMETHICONE 80 MG PO CHEW: 80.0000 mg | CHEWABLE_TABLET | ORAL | Status: DC | PRN

## 2012-12-20 MED ORDER — METHYLERGONOVINE MALEATE 0.2 MG PO TABS: 0.2000 mg | ORAL_TABLET | ORAL | Status: DC | PRN

## 2012-12-20 MED ORDER — DIPHENHYDRAMINE HCL 25 MG PO CAPS: 25.0000 mg | ORAL_CAPSULE | Freq: Four times a day (QID) | ORAL | Status: DC | PRN

## 2012-12-20 MED ORDER — ONDANSETRON HCL 4 MG/2ML IJ SOLN: 4.0000 mg | INTRAMUSCULAR | Status: DC | PRN

## 2012-12-20 MED ORDER — LANOLIN HYDROUS EX OINT: TOPICAL_OINTMENT | CUTANEOUS | Status: DC | PRN

## 2012-12-20 MED ORDER — METHYLERGONOVINE MALEATE 0.2 MG/ML IJ SOLN: 0.2000 mg | INTRAMUSCULAR | Status: DC | PRN

## 2012-12-20 MED ORDER — IBUPROFEN 600 MG PO TABS
600.0000 mg | ORAL_TABLET | Freq: Four times a day (QID) | ORAL | Status: DC
  Administered 2012-12-20 – 2012-12-21 (×4): 600 mg via ORAL
  Filled 2012-12-20 (×4): qty 1

## 2012-12-20 MED ORDER — TETANUS-DIPHTH-ACELL PERTUSSIS 5-2.5-18.5 LF-MCG/0.5 IM SUSP: 0.5000 mL | Freq: Once | INTRAMUSCULAR | Status: DC

## 2012-12-20 MED ORDER — OXYCODONE-ACETAMINOPHEN 5-325 MG PO TABS: 1.0000 | ORAL_TABLET | ORAL | Status: DC | PRN

## 2012-12-20 MED ORDER — BENZOCAINE-MENTHOL 20-0.5 % EX AERO
1.0000 "application " | INHALATION_SPRAY | CUTANEOUS | Status: DC | PRN
  Administered 2012-12-20: 1 via TOPICAL
  Filled 2012-12-20: qty 56

## 2012-12-20 NOTE — Progress Notes (Signed)
Post Partum Day 0 Subjective: no complaints, up ad lib, voiding and tolerating PO  Objective: Blood pressure 97/65, pulse 71, temperature 97.6 F (36.4 C), temperature source Oral, resp. rate 18, height 5\' 6"  (1.676 m), weight 70.308 kg (155 lb), last menstrual period 03/05/2012, SpO2 98.00%, unknown if currently breastfeeding.  Physical Exam:  General: alert, cooperative and appears stated age Lochia: appropriate Uterine Fundus: firm   Recent Labs  12/19/12 1810  HGB 12.3  HCT 34.8*    Assessment/Plan: Breastfeeding   LOS: 1 day   Jo Campbell H. 12/20/2012, 10:05 AM

## 2012-12-20 NOTE — Progress Notes (Signed)
Epidural catheter removed. Tip intact

## 2012-12-21 LAB — CBC
HCT: 28.5 % — ABNORMAL LOW (ref 36.0–46.0)
RDW: 14.1 % (ref 11.5–15.5)
WBC: 8.9 10*3/uL (ref 4.0–10.5)

## 2012-12-21 NOTE — Anesthesia Postprocedure Evaluation (Signed)
  Anesthesia Post-op Note  Patient stable following vaginal delivery.  

## 2012-12-21 NOTE — Progress Notes (Signed)
Post Partum Day 1 Subjective: no complaints, desires early discharge.  Objective: Blood pressure 112/68, pulse 80, temperature 97.4 F (36.3 C), temperature source Oral, resp. rate 18, height 5\' 6"  (1.676 m), weight 70.308 kg (155 lb), last menstrual period 03/05/2012, SpO2 97.00%, breastfeeding.  Physical Exam:  General: alert Lochia: appropriate Uterine Fundus: firm   Recent Labs  12/19/12 1810 12/21/12 0615  HGB 12.3 9.8*  HCT 34.8* 28.5*    Assessment/Plan: Discharge home if baby is ready.   LOS: 2 days   Cyrstal Leitz D 12/21/2012, 9:35 AM

## 2012-12-21 NOTE — Discharge Summary (Signed)
Obstetric Discharge Summary Reason for Admission: induction of labor Prenatal Procedures: ultrasound Intrapartum Procedures: spontaneous vaginal delivery Postpartum Procedures: none Complications-Operative and Postpartum: 2nd degree perineal laceration Hemoglobin  Date Value Range Status  12/21/2012 9.8* 12.0 - 15.0 g/dL Final     REPEATED TO VERIFY     DELTA CHECK NOTED     HCT  Date Value Range Status  12/21/2012 28.5* 36.0 - 46.0 % Final    Physical Exam:  General: alert Lochia: appropriate Uterine Fundus: firm  Discharge Diagnoses: Term Pregnancy-delivered  Discharge Information: Date: 12/21/2012 Activity: pelvic rest Diet: routine Medications: PNV and Ibuprofen Condition: stable Instructions: refer to practice specific booklet Discharge to: home Follow-up Information   Follow up with Almon Hercules., MD. Schedule an appointment as soon as possible for a visit in 4 weeks.   Contact information:   [address] [place] [address] [phone]       Newborn Data: Live born female  Birth Weight: 7 lb 4.2 oz (3294 g) APGAR: 5, 9  Home with mother.  Shantese Raven D 12/21/2012, 10:30 AM

## 2012-12-26 ENCOUNTER — Ambulatory Visit (HOSPITAL_COMMUNITY)
Admission: RE | Admit: 2012-12-26 | Discharge: 2012-12-26 | Disposition: A | Discharge status: Home / Self Care | Payer: BC Managed Care – PPO | Source: Ambulatory Visit | Attending: Obstetrics and Gynecology | Admitting: Obstetrics and Gynecology

## 2012-12-26 NOTE — Lactation Note (Signed)
Adult Lactation Consultation Outpatient Visit Note  Patient Name: Jo Campbell Date of Birth: Apr 08, 1976 Gestational Age at Delivery: [redacted]w[redacted]d Type of Delivery:   Breastfeeding History: Frequency of Breastfeeding: q 3 hours Length of Feeding: 5-10 min Voids: QS Stools: QS  Supplementing / Method: Pumping:  Type of Pump: Has Medela pump for home    Frequency: 3-4 times/day  Volume:  2-3 oz  Comments:    Consultation Evaluation: Mom reports that breasts are very full. Baby is only nursing on one breast at a feeding. Mom reports that nipples are sore but getting better.  Mom states baby is doing some spitting and some choking at the breast. Experienced BF mom- nursed her first baby for 9 months.  Initial Feeding Assessment: Pre-feed Weight: 7- 5.4  3328g Post-feed Weight: 7- 7.9 3400g Amount Transferred: 72 cc's Comments: Baby had fed 1 1/2 hours ago. Mom with very full breasts. Nipples look pink but intact. Assisted mom in getting a deeper latch and untucked her bottom lip. Mom reports that feels better. Reviewed wide open mouth and keeping the baby close to the breast throughout the feeding. Baby nursed for about 3 minutes and then spit up large amount. Baby calmed and then she was rooting again. Latched again and she nursed for 5 minutes with lots of swallows noted. She does do some clicking when the milk is flowing very fast to slow the flow down. Did come off the breast choking a few times during the feeding. Baby content and did not spit up any more.    Total Breast milk Transferred this Visit: 72 Total Supplement Given: 0  Additional Interventions: Encouraged mom to pump for a few minutes just prior to nursing to soften breast and get milk flowing before latching the baby. Comfort gels given to replace her first set. To rub EBM into nipples after nursing. Watch for deep latch. No further questions at present. To call prn   Follow-Up With Ped If needed can make another  appointment here.     [name] 12/26/2012, 9:54 AM

## 2013-01-20 ENCOUNTER — Other Ambulatory Visit: Payer: Self-pay | Admitting: Obstetrics and Gynecology

## 2014-04-02 ENCOUNTER — Encounter (HOSPITAL_COMMUNITY): Payer: Self-pay | Admitting: Obstetrics

## 2014-07-13 IMAGING — US US OB COMP LESS 14 WK
1 series · 14 of 28 positions shown · non-contrast
Comparison: none

CLINICAL DATA: Bleeding

OBSTETRIC <14 WK ULTRASOUND,
TECHNIQUE: Transabdominal  ultrasound was performed for evaluation
of the gestation as well as the maternal uterus and adnexal
regions.

[Series 1: us ob comp less 14 wks · 14 of 46 slices shown]
[im 2/46]
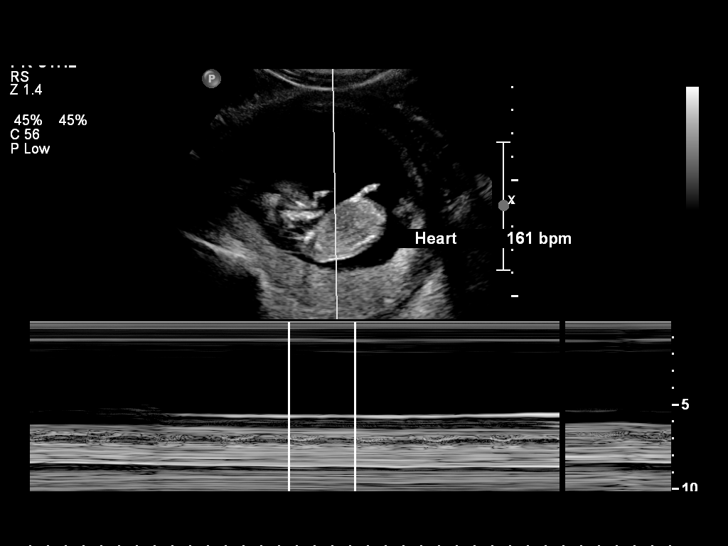
[im 6/46]
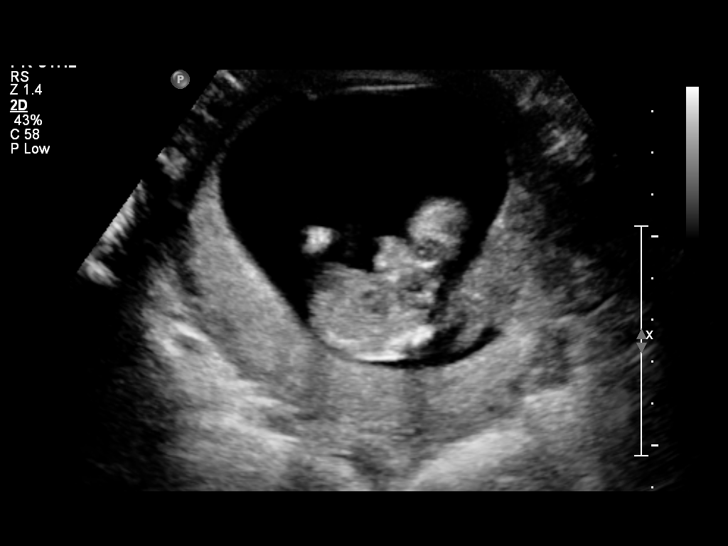
[im 9/46]
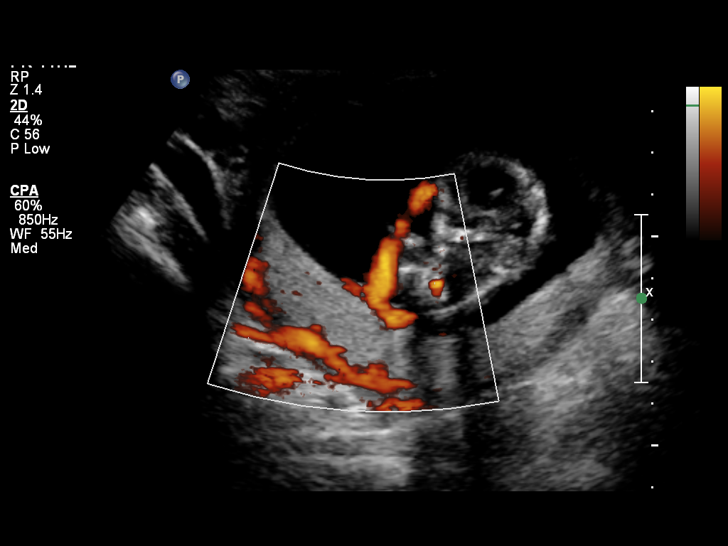
[im 12/46]
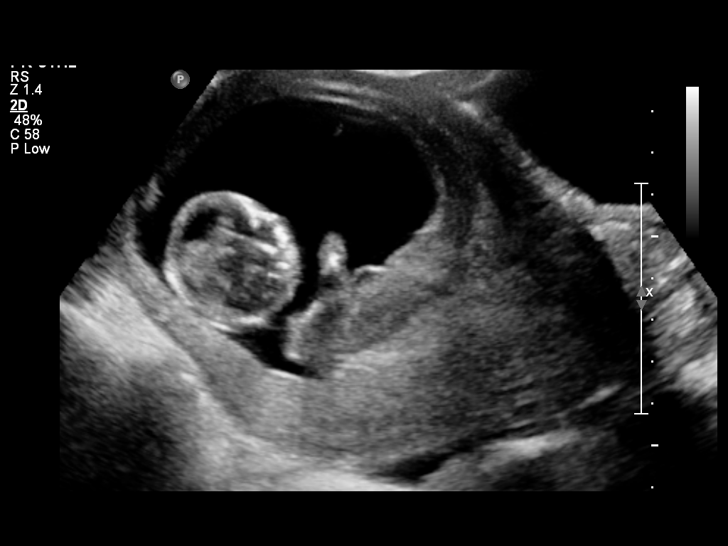
[im 16/46]
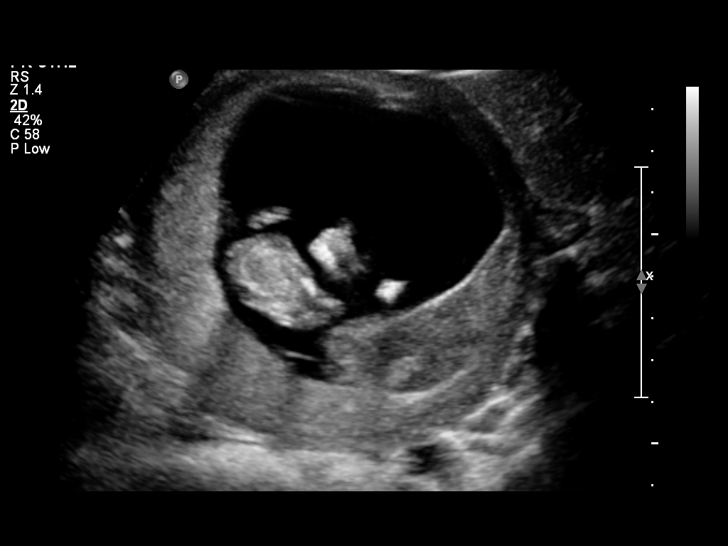
[im 19/46]
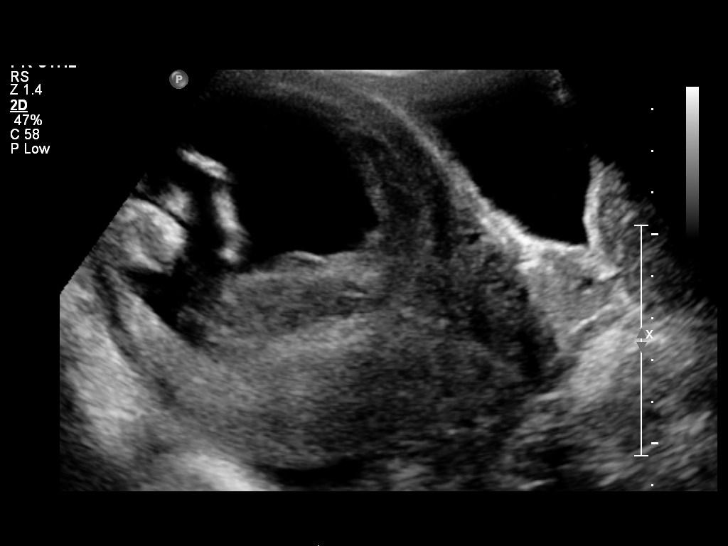
[im 22/46]
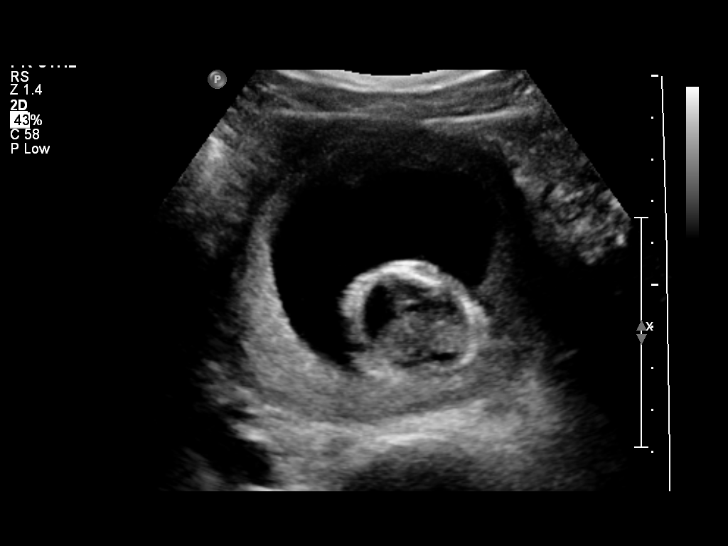
[im 26/46]
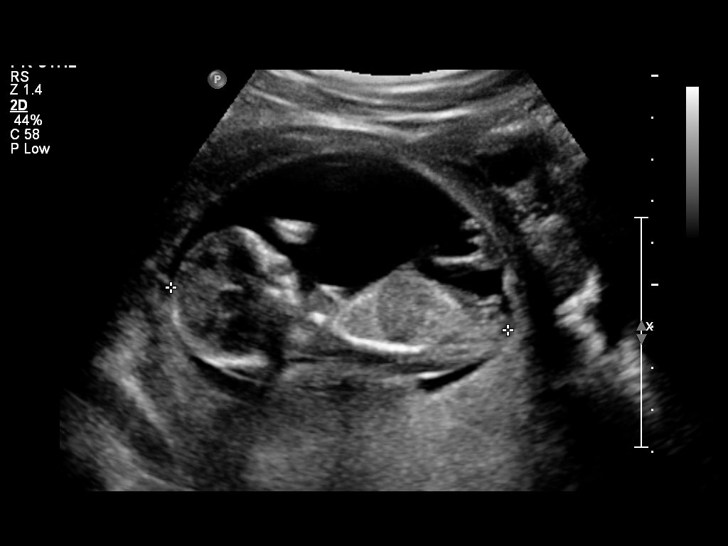
[im 29/46]
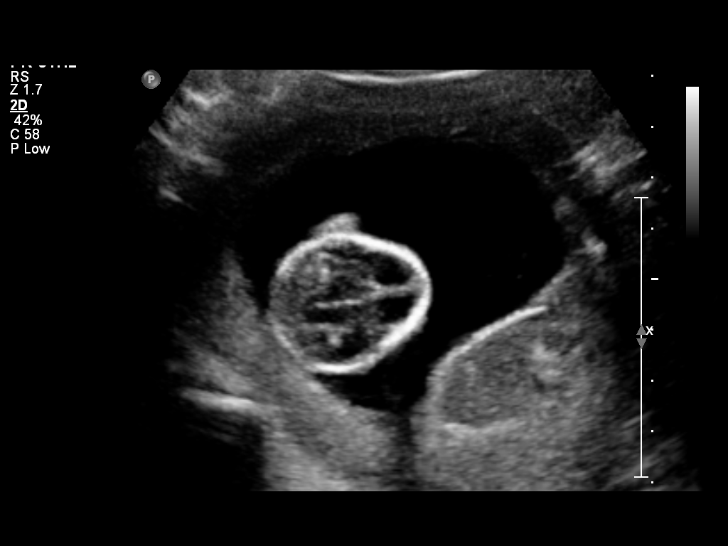
[im 32/46]
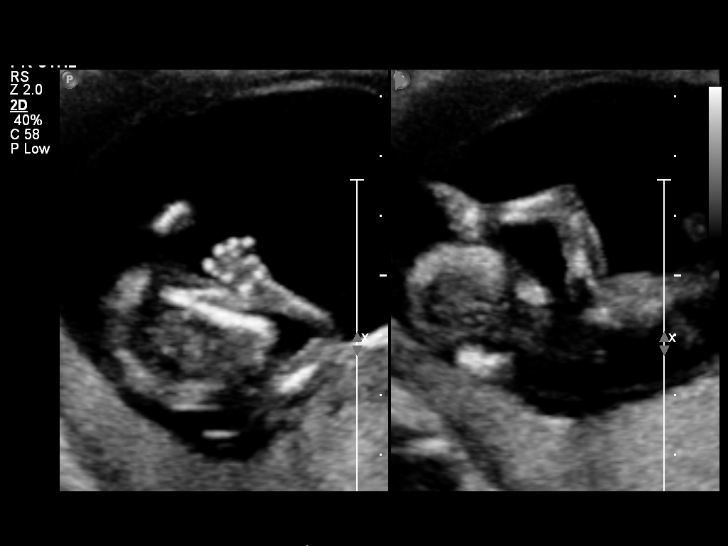
[im 36/46]
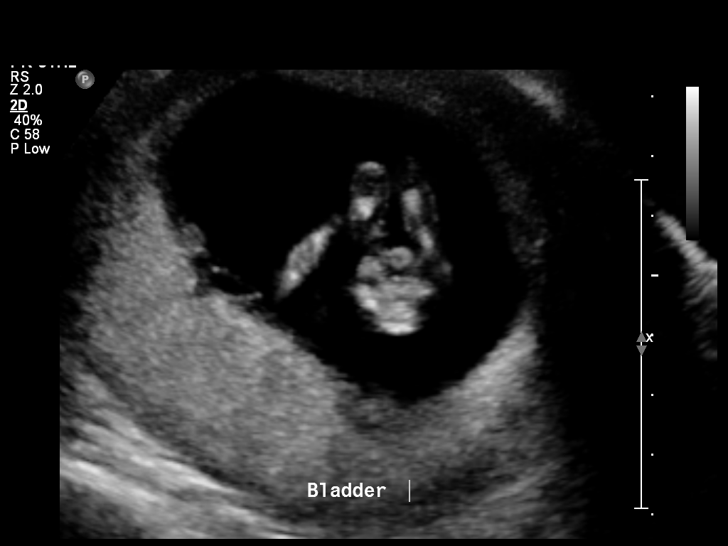
[im 39/46]
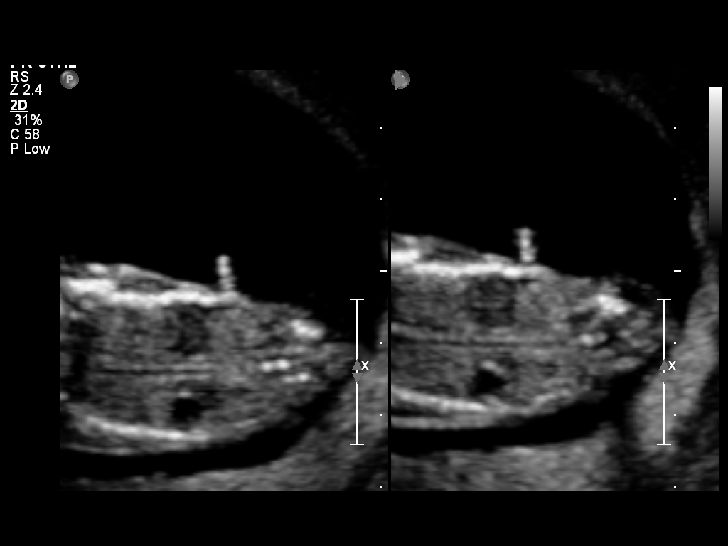
[im 42/46]
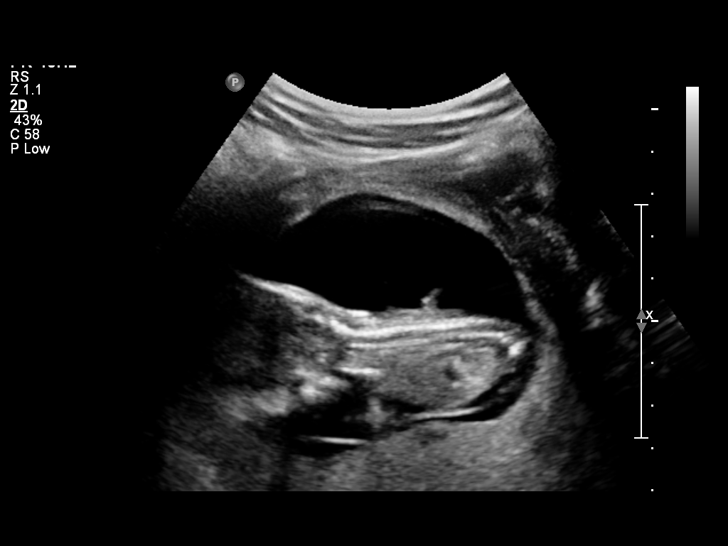
[im 46/46]
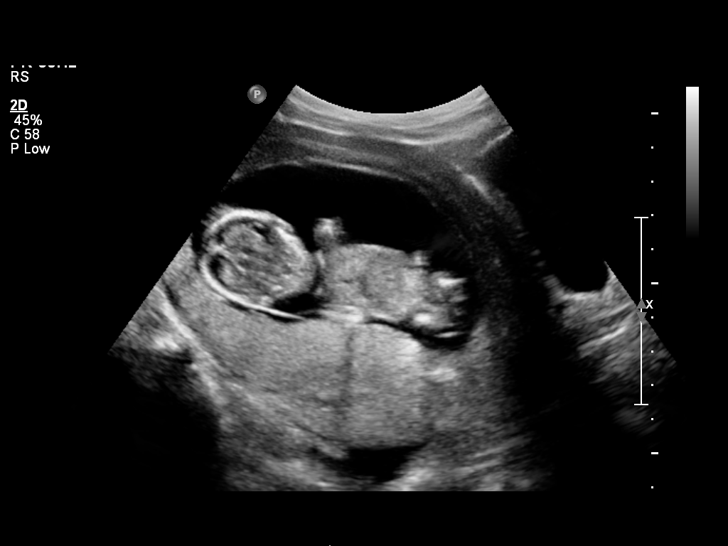

[14 of 28 positions shown; findings below may reference images not displayed]

Number of gestation: 1
Heart Rate: 161 bpm

CRL:  7.8         13w  6d                  US EDC: 12/16/2012

Maternal uterus/adnexae:
Large subchorionic hemorrhage measuring 6.4 x 3.1 x 5.6 cm.

Normal appearance of the right ovary.  The left ovary is not
visualized.

Cervical length equals 3.3 cm.

Posterior placenta.

Trace free fluid within the pelvis.
IMPRESSION: 1.  Single living intrauterine gestation with an estimated
gestational age of 13 weeks and 6 days.
2.  Large subchorionic hemorrhage.

## 2014-08-13 ENCOUNTER — Other Ambulatory Visit: Payer: Self-pay | Admitting: Obstetrics and Gynecology

## 2014-08-14 LAB — CYTOLOGY - PAP

## 2015-09-03 ENCOUNTER — Other Ambulatory Visit: Payer: Self-pay | Admitting: Obstetrics and Gynecology

## 2015-09-04 LAB — CYTOLOGY - PAP

## 2016-09-24 ENCOUNTER — Other Ambulatory Visit: Payer: Self-pay | Admitting: Obstetrics and Gynecology

## 2016-09-25 LAB — CYTOLOGY - PAP

## 2017-10-11 DIAGNOSIS — Z1231 Encounter for screening mammogram for malignant neoplasm of breast: Secondary | ICD-10-CM | POA: Diagnosis not present

## 2017-10-11 DIAGNOSIS — Z124 Encounter for screening for malignant neoplasm of cervix: Secondary | ICD-10-CM | POA: Diagnosis not present

## 2017-10-11 DIAGNOSIS — R87610 Atypical squamous cells of undetermined significance on cytologic smear of cervix (ASC-US): Secondary | ICD-10-CM | POA: Diagnosis not present

## 2017-10-11 DIAGNOSIS — Z01419 Encounter for gynecological examination (general) (routine) without abnormal findings: Secondary | ICD-10-CM | POA: Diagnosis not present

## 2017-10-11 DIAGNOSIS — Z6820 Body mass index (BMI) 20.0-20.9, adult: Secondary | ICD-10-CM | POA: Diagnosis not present

## 2017-12-06 DIAGNOSIS — D2262 Melanocytic nevi of left upper limb, including shoulder: Secondary | ICD-10-CM | POA: Diagnosis not present

## 2017-12-06 DIAGNOSIS — D2261 Melanocytic nevi of right upper limb, including shoulder: Secondary | ICD-10-CM | POA: Diagnosis not present

## 2017-12-06 DIAGNOSIS — L821 Other seborrheic keratosis: Secondary | ICD-10-CM | POA: Diagnosis not present

## 2017-12-06 DIAGNOSIS — D225 Melanocytic nevi of trunk: Secondary | ICD-10-CM | POA: Diagnosis not present

## 2018-05-16 DIAGNOSIS — Z0000 Encounter for general adult medical examination without abnormal findings: Secondary | ICD-10-CM | POA: Diagnosis not present

## 2018-05-16 DIAGNOSIS — B001 Herpesviral vesicular dermatitis: Secondary | ICD-10-CM | POA: Diagnosis not present

## 2018-05-16 DIAGNOSIS — E785 Hyperlipidemia, unspecified: Secondary | ICD-10-CM | POA: Diagnosis not present

## 2018-12-29 DIAGNOSIS — B029 Zoster without complications: Secondary | ICD-10-CM | POA: Diagnosis not present

## 2019-02-28 DIAGNOSIS — Z23 Encounter for immunization: Secondary | ICD-10-CM | POA: Diagnosis not present

## 2019-03-21 DIAGNOSIS — Z124 Encounter for screening for malignant neoplasm of cervix: Secondary | ICD-10-CM | POA: Diagnosis not present

## 2019-03-21 DIAGNOSIS — Z1231 Encounter for screening mammogram for malignant neoplasm of breast: Secondary | ICD-10-CM | POA: Diagnosis not present

## 2019-03-21 DIAGNOSIS — Z01419 Encounter for gynecological examination (general) (routine) without abnormal findings: Secondary | ICD-10-CM | POA: Diagnosis not present

## 2019-03-21 DIAGNOSIS — Z6821 Body mass index (BMI) 21.0-21.9, adult: Secondary | ICD-10-CM | POA: Diagnosis not present

## 2020-11-29 ENCOUNTER — Other Ambulatory Visit: Payer: Self-pay

## 2020-11-29 ENCOUNTER — Telehealth: Payer: Self-pay | Admitting: *Deleted

## 2020-11-29 ENCOUNTER — Ambulatory Visit: Payer: No Typology Code available for payment source | Admitting: Podiatry

## 2020-11-29 DIAGNOSIS — M79676 Pain in unspecified toe(s): Secondary | ICD-10-CM | POA: Diagnosis not present

## 2020-11-29 DIAGNOSIS — L601 Onycholysis: Secondary | ICD-10-CM

## 2020-11-29 DIAGNOSIS — L603 Nail dystrophy: Secondary | ICD-10-CM

## 2020-11-29 NOTE — Progress Notes (Signed)
  Subjective:  Patient ID: Jo Campbell, female    DOB: Oct 04, 1975,  MRN: [id number]  Chief Complaint  Patient presents with   Nail Problem    Left 1st toenail pain, believes she injured the nail 6 months ago. Discolored and painful. Right 1st toenail, "Not growing correctly"   45 y.o. female presents with the above complaint. History confirmed with patient.   Objective:  Physical Exam: warm, good capillary refill, no trophic changes or ulcerative lesions, normal DP and PT pulses, and normal sensory exam.  Painful loose nail at of the left, hallux; without warmth, erythema or drainage. Transverse ridging noted. Right hallux nail with single transverse ridge but proximally growing clear and smooth  Assessment:   1. Onycholysis   2. Pain around toenail   3. Nail dystrophy    Plan:  Patient was evaluated and treated and all questions answered.  Onycholysis, left -Patient elects to proceed with ingrown toenail removal today -Educated on post-procedure care including soaking. Written instructions provided. -Right hallux nail gently debrided.  Procedure: Avulsion of toenail Location: Left 1st toe  Anesthesia: Lidocaine 1% plain; 1.5 mL and Marcaine 0.5% plain; 1.5 mL, digital block. Skin Prep: Betadine. Dressing: Silvadene; telfa; dry, sterile, compression dressing. Technique: Following skin prep, the toe was exsanguinated and a tourniquet was secured at the base of the toe. The nail was freed and avulsed with a hemostat. The area was cleansed. The tourniquet was then removed and sterile dressing applied. Disposition: Patient tolerated procedure well.

## 2020-11-29 NOTE — Telephone Encounter (Signed)
Patient is requesting something to help her manage pain after her nail removal today. Please advise.

## 2020-11-29 NOTE — Telephone Encounter (Signed)
Returned call to patient and gave information per Dr Samuella Cota to take OTC ibuprofen or tylenol. The pain should ease up over the next few days, if not please call back.She verbalized understanding.

## 2020-11-29 NOTE — Patient Instructions (Signed)

## 2021-01-17 ENCOUNTER — Ambulatory Visit: Payer: No Typology Code available for payment source | Admitting: Podiatry

## 2021-02-07 ENCOUNTER — Ambulatory Visit: Payer: No Typology Code available for payment source | Admitting: Podiatry

## 2021-02-14 ENCOUNTER — Other Ambulatory Visit: Payer: Self-pay

## 2021-02-14 ENCOUNTER — Ambulatory Visit: Payer: No Typology Code available for payment source | Admitting: Podiatry

## 2021-02-14 DIAGNOSIS — L603 Nail dystrophy: Secondary | ICD-10-CM

## 2021-02-14 DIAGNOSIS — L601 Onycholysis: Secondary | ICD-10-CM

## 2021-02-14 DIAGNOSIS — M79676 Pain in unspecified toe(s): Secondary | ICD-10-CM

## 2021-02-14 NOTE — Progress Notes (Signed)
  Subjective:  Patient ID: Jo Campbell, female    DOB: 02-11-1976,  MRN: [id number]  Chief Complaint  Patient presents with   Nail Problem    Left 1st toenail follow up. Pt states not much nail has grown back.   45 y.o. female presents with the above complaint. History confirmed with patient.   Objective:  Physical Exam: warm, good capillary refill, no trophic changes or ulcerative lesions, normal DP and PT pulses, and normal sensory exam. Left hallux nail appears with good clear growth - approximately 1/3rd of nail regrowth. No warmth, erythema, areas of ingrown nail.  Assessment:   1. Onycholysis   2. Pain around toenail   3. Nail dystrophy     Plan:  Patient was evaluated and treated and all questions answered.  Onycholysis, left -Nail continues to grow back slowly - it appears very healthy. Mild tenderness but otherwise no issues. Discussed continuing to wait until it grows out more. F/u should she not see further nail growth or for any pain or issues.

## 2024-05-28 ENCOUNTER — Other Ambulatory Visit (HOSPITAL_COMMUNITY): Payer: Self-pay

## 2024-05-28 MED ORDER — OSELTAMIVIR PHOSPHATE 75 MG PO CAPS
75.0000 mg | ORAL_CAPSULE | Freq: Two times a day (BID) | ORAL | 0 refills | Status: AC
  Filled 2024-05-28: qty 10, 5d supply, fill #0
